# Patient Record
Sex: Male | Born: 1985 | Race: White | Hispanic: No | Marital: Single | Smoking: Former smoker
Health system: Southern US, Community
[De-identification: ages and names within clinical notes are randomized; demographics above are authoritative.]

## PROBLEM LIST (undated history)

## (undated) HISTORY — PX: CYST EXCISION: SHX5701

## (undated) HISTORY — PX: FRACTURE SURGERY: SHX138

## (undated) HISTORY — PX: FOOT FRACTURE SURGERY: SHX645

---

## 2016-01-19 ENCOUNTER — Ambulatory Visit: Payer: Self-pay | Admitting: Adult Health

## 2016-01-19 DIAGNOSIS — Z0289 Encounter for other administrative examinations: Secondary | ICD-10-CM

## 2017-06-06 ENCOUNTER — Ambulatory Visit (INDEPENDENT_AMBULATORY_CARE_PROVIDER_SITE_OTHER): Payer: BLUE CROSS/BLUE SHIELD | Admitting: Adult Health

## 2017-06-06 ENCOUNTER — Encounter: Payer: Self-pay | Admitting: Adult Health

## 2017-06-06 ENCOUNTER — Other Ambulatory Visit: Payer: Self-pay | Admitting: Adult Health

## 2017-06-06 VITALS — BP 107/63 | HR 69 | Ht 74.0 in | Wt 182.3 lb

## 2017-06-06 DIAGNOSIS — N5089 Other specified disorders of the male genital organs: Secondary | ICD-10-CM | POA: Insufficient documentation

## 2017-06-06 DIAGNOSIS — Z0000 Encounter for general adult medical examination without abnormal findings: Secondary | ICD-10-CM | POA: Diagnosis not present

## 2017-06-06 NOTE — Progress Notes (Signed)
Subjective:    Patient ID: Gary Maxwell, male    DOB: [birth year], 31 y.o.   MRN: [id number]  HPI:  [name] is here to establish as a new pt.  He is a very pleasant 31 year old.  PMH:  Cyst excision from lower back and surgical repair of fx'd 5th metatarsal and fx'd L FA.  He drinks water all day (at least 80 ounces) and follows a heart healthy diet, he actually grows a lot of his own fruits/vegetables.  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  He has one acute compliant today: L testicular pain that started > 3 weeks ago.  He denies acute/injury trauma to L testicle prior to onset of pain.  He cannot describe the characteristics of the pain, however feels that it occurs intermittently and is rated 6/10.  He denies penile discharge, hematuria, N/V/D/constipation.  He has had the same monogamous sexual partner for > years and reports consistent condom use.  He reports that his biological father had a testicle removed at age 30, reason-? His biological father passed away at age 30 from suicide. He denies tobacco/regular ETOH use.    Patient Care Team    Relationship Specialty Notifications Start End  Onida, Jinny Blossom, NP PCP - General Family Medicine  05/28/17     There are no active problems to display for this patient.    History reviewed. No pertinent past medical history.   Past Surgical History:  Procedure Laterality Date  . CYST EXCISION     lower back  . FOOT FRACTURE SURGERY Right    5th metatarsal  . FRACTURE SURGERY Left    arm     Family History  Problem Relation Age of Onset  . Healthy Sister   . Healthy Brother      History  Drug Use No     History  Alcohol Use  . Yes    Comment: occassionally     History  Smoking Status  . Former Smoker  . Packs/day: 1.00  . Years: 5.00  . Types: Cigarettes  . Quit date: 09/10/2010  Smokeless Tobacco  . Never Used      Outpatient Encounter Prescriptions as of 06/06/2017  Medication Sig  . Multiple Vitamin (MULTIVITAMIN) tablet Take 1 tablet by mouth daily.   No facility-administered encounter medications on file as of 06/06/2017.     Allergies:  Patient has no known allergies.  There is no height or weight on file to calculate BMI.  There were no vitals taken for this visit.   Review of Systems  Constitutional: Positive for fatigue. Negative for activity change, appetite change, chills, diaphoresis, fever and unexpected weight change.  HENT: Negative for congestion.   Eyes: Negative for visual disturbance.  Respiratory: Negative for cough, chest tightness, shortness of breath, wheezing and stridor.   Cardiovascular: Negative for chest pain, palpitations and leg swelling.  Gastrointestinal: Negative for abdominal distention, blood in stool, constipation, diarrhea, nausea and vomiting.  Endocrine: Negative for cold intolerance, heat intolerance, polydipsia, polyphagia and polyuria.  Genitourinary: Positive for testicular pain. Negative for decreased urine volume, difficulty urinating, discharge, flank pain, genital sores, hematuria, penile pain, penile swelling, scrotal swelling and urgency.  Musculoskeletal: Negative for arthralgias, back pain, gait problem, joint swelling, myalgias, neck pain and neck stiffness.  Skin: Negative for color change, pallor, rash and wound.  Neurological: Negative for dizziness, tremors,  weakness, light-headedness and headaches.  Hematological: Does not bruise/bleed easily.  Psychiatric/Behavioral: Negative for decreased concentration, dysphoric mood, hallucinations, self-injury, sleep disturbance and suicidal ideas. The patient is not nervous/anxious and is not hyperactive.        Objective:   Physical Exam  Constitutional: He is oriented to person, place, and time. He appears well-developed and well-nourished. No distress.  HENT:  Head: Normocephalic and  atraumatic.  Right Ear: External ear normal.  Left Ear: External ear normal.  Eyes: Pupils are equal, round, and reactive to light. Conjunctivae are normal.  Cardiovascular: Normal rate, regular rhythm, normal heart sounds and intact distal pulses.   No murmur heard. Pulmonary/Chest: Effort normal and breath sounds normal. No respiratory distress. He has no wheezes. He has no rales. He exhibits no tenderness.  Abdominal: Soft. Bowel sounds are normal. He exhibits no distension and no mass. There is no tenderness. There is no rebound and no guarding. Hernia confirmed negative in the right inguinal area and confirmed negative in the left inguinal area.  Genitourinary: Prostate normal and penis normal. Right testis shows no mass, no swelling and no tenderness. Right testis is descended. Cremasteric reflex is not absent on the right side. Left testis shows tenderness. Left testis shows no mass and no swelling. Left testis is descended. Cremasteric reflex is not absent on the left side. No penile tenderness.  Genitourinary Comments: Chaperone present during examination.  Lymphadenopathy:       Right: No inguinal adenopathy present.       Left: No inguinal adenopathy present.  Neurological: He is alert and oriented to person, place, and time.  Skin: Skin is warm and dry. No rash noted. He is not diaphoretic. No erythema. No pallor.  Psychiatric: He has a normal mood and affect. His behavior is normal. Judgment and thought content normal.  Nursing note and vitals reviewed.         Assessment & Plan:   1. Testicular swelling, left   2. Healthcare maintenance     Testicular swelling, left Physical examination negative. Father had one testicle removed, reason unknown.  Father deceased at age 30 from suicide. [place] ordered.  Healthcare maintenance Continue to drink water and follow Heart Healthy diet, super great that your grown a lot of your own food. Continue regular movement. Scrotal  Ultrasound ordered. Please schedule complete physical with fasting labs this fall/winter.    FOLLOW-UP:  Return in about 3 months (around 09/05/2017) for CPE, Fasting Lab Draw.

## 2017-06-06 NOTE — Assessment & Plan Note (Signed)
Physical examination negative. Father had one testicle removed, reason unknown.  Father deceased at age 30 from suicide. [place] ordered.

## 2017-06-06 NOTE — Patient Instructions (Addendum)
Heart-Healthy Eating Plan Many factors influence your heart health, including eating and exercise habits. Heart (coronary) risk increases with abnormal blood fat (lipid) levels. Heart-healthy meal planning includes limiting unhealthy fats, increasing healthy fats, and making other small dietary changes. This includes maintaining a healthy body weight to help keep lipid levels within a normal range. What is my plan? Your health care provider recommends that you:  Get no more than __25__% of the total calories in your daily diet from fat.  Limit your intake of saturated fat to less than __5__% of your total calories each day.  Limit the amount of cholesterol in your diet to less than __300__ mg per day.  What types of fat should I choose?  Choose healthy fats more often. Choose monounsaturated and polyunsaturated fats, such as olive oil and canola oil, flaxseeds, walnuts, almonds, and seeds.  Eat more omega-3 fats. Good choices include salmon, mackerel, sardines, tuna, flaxseed oil, and ground flaxseeds. Aim to eat fish at least two times each week.  Limit saturated fats. Saturated fats are primarily found in animal products, such as meats, butter, and cream. Plant sources of saturated fats include palm oil, palm kernel oil, and coconut oil.  Avoid foods with partially hydrogenated oils in them. These contain trans fats. Examples of foods that contain trans fats are stick margarine, some tub margarines, cookies, crackers, and other baked goods. What general guidelines do I need to follow?  Check food labels carefully to identify foods with trans fats or high amounts of saturated fat.  Fill one half of your plate with vegetables and green salads. Eat 4-5 servings of vegetables per day. A serving of vegetables equals 1 cup of raw leafy vegetables,  cup of raw or cooked cut-up vegetables, or  cup of vegetable juice.  Fill one fourth of your plate with whole grains. Look for the word "whole" as  the first word in the ingredient list.  Fill one fourth of your plate with lean protein foods.  Eat 4-5 servings of fruit per day. A serving of fruit equals one medium whole fruit,  cup of dried fruit,  cup of fresh, frozen, or canned fruit, or  cup of 100% fruit juice.  Eat more foods that contain soluble fiber. Examples of foods that contain this type of fiber are apples, broccoli, carrots, beans, peas, and barley. Aim to get 20-30 g of fiber per day.  Eat more home-cooked food and less restaurant, buffet, and fast food.  Limit or avoid alcohol.  Limit foods that are high in starch and sugar.  Avoid fried foods.  Cook foods by using methods other than frying. Baking, boiling, grilling, and broiling are all great options. Other fat-reducing suggestions include: ? Removing the skin from poultry. ? Removing all visible fats from meats. ? Skimming the fat off of stews, soups, and gravies before serving them. ? Steaming vegetables in water or broth.  Lose weight if you are overweight. Losing just 5-10% of your initial body weight can help your overall health and prevent diseases such as diabetes and heart disease.  Increase your consumption of nuts, legumes, and seeds to 4-5 servings per week. One serving of dried beans or legumes equals  cup after being cooked, one serving of nuts equals 1 ounces, and one serving of seeds equals  ounce or 1 tablespoon.  You may need to monitor your salt (sodium) intake, especially if you have high blood pressure. Talk with your health care provider or dietitian to get  more information about reducing sodium. What foods can I eat? Grains  Breads, including [place], white, pita, wheat, raisin, rye, oatmeal, and [place]. Tortillas that are neither fried nor made with lard or trans fat. Low-fat rolls, including hotdog and hamburger buns and English muffins. Biscuits. Muffins. Waffles. Pancakes. Light popcorn. Whole-grain cereals. Flatbread. Melba toast.  Pretzels. Breadsticks. Rusks. Low-fat snacks and crackers, including oyster, saltine, matzo, graham, animal, and rye. Rice and pasta, including brown rice and those that are made with whole wheat. Vegetables All vegetables. Fruits All fruits, but limit coconut. Meats and Other Protein Sources Lean, well-trimmed beef, veal, pork, and lamb. Chicken and [place] without skin. All fish and shellfish. Wild duck, rabbit, pheasant, and venison. Egg whites or low-cholesterol egg substitutes. Dried beans, peas, lentils, and tofu.Seeds and most nuts. Dairy Low-fat or nonfat cheeses, including ricotta, string, and mozzarella. Skim or 1% milk that is liquid, powdered, or evaporated. Buttermilk that is made with low-fat milk. Nonfat or low-fat yogurt. Beverages Mineral water. Diet carbonated beverages. Sweets and Desserts Sherbets and fruit ices. Honey, jam, marmalade, jelly, and syrups. Meringues and gelatins. Pure sugar candy, such as hard candy, jelly beans, gumdrops, mints, marshmallows, and small amounts of dark chocolate. [employer] Eat all sweets and desserts in moderation. Fats and Oils Nonhydrogenated (trans-free) margarines. Vegetable oils, including soybean, sesame, sunflower, olive, peanut, safflower, corn, canola, and cottonseed. Salad dressings or mayonnaise that are made with a vegetable oil. Limit added fats and oils that you use for cooking, baking, salads, and as spreads. Other Cocoa powder. Coffee and tea. All seasonings and condiments. The items listed above may not be a complete list of recommended foods or beverages. Contact your dietitian for more options. What foods are not recommended? Grains Breads that are made with saturated or trans fats, oils, or whole milk. Croissants. Butter rolls. Cheese breads. Sweet rolls. Donuts. Buttered popcorn. Chow mein noodles. High-fat crackers, such as cheese or butter crackers. Meats and Other Protein Sources Fatty meats, such as hotdogs,  short ribs, sausage, spareribs, bacon, ribeye roast or steak, and mutton. High-fat deli meats, such as salami and bologna. Caviar. Domestic duck and goose. Organ meats, such as kidney, liver, sweetbreads, brains, gizzard, chitterlings, and heart. Dairy Cream, sour cream, cream cheese, and creamed cottage cheese. Whole milk cheeses, including blue (bleu), Monterey Jack, [place], [place], American, [place], Swiss, [place], [place], and [place]. Whole or 2% milk that is liquid, evaporated, or condensed. Whole buttermilk. Cream sauce or high-fat cheese sauce. Yogurt that is made from whole milk. Beverages Regular sodas and drinks with added sugar. Sweets and Desserts Frosting. Pudding. Cookies. Cakes other than angel food cake. Candy that has milk chocolate or white chocolate, hydrogenated fat, butter, coconut, or unknown ingredients. Buttered syrups. Full-fat ice cream or ice cream drinks. Fats and Oils Gravy that has suet, meat fat, or shortening. Cocoa butter, hydrogenated oils, palm oil, coconut oil, palm kernel oil. These can often be found in baked products, candy, fried foods, nondairy creamers, and whipped toppings. Solid fats and shortenings, including bacon fat, salt pork, lard, and butter. Nondairy cream substitutes, such as coffee creamers and sour cream substitutes. Salad dressings that are made of unknown oils, cheese, or sour cream. The items listed above may not be a complete list of foods and beverages to avoid. Contact your dietitian for more information. This information is not intended to replace advice given to you by your health care provider. Make sure you discuss any questions you have with your health care  provider. Document Released: 06/05/2008 Document Revised: 03/16/2016 Document Reviewed: 02/18/2014 Elsevier Interactive Patient Education  2017 [employer].   Continue to drink water and follow Heart Healthy diet, super great that your grown a lot of your own food. Continue  regular movement. Scrotal Ultrasound ordered. Please schedule complete physical with fasting labs this fall/winter. WELCOME TO THE PRACTICE!

## 2017-06-06 NOTE — Assessment & Plan Note (Signed)
Continue to drink water and follow Heart Healthy diet, super great that your grown a lot of your own food. Continue regular movement. Scrotal Ultrasound ordered. Please schedule complete physical with fasting labs this fall/winter.

## 2017-06-11 ENCOUNTER — Ambulatory Visit
Admission: RE | Admit: 2017-06-11 | Discharge: 2017-06-11 | Disposition: A | Discharge status: Home / Self Care | Payer: BLUE CROSS/BLUE SHIELD | Source: Ambulatory Visit | Attending: Adult Health | Admitting: Adult Health

## 2017-06-11 DIAGNOSIS — N5089 Other specified disorders of the male genital organs: Secondary | ICD-10-CM

## 2017-06-16 ENCOUNTER — Other Ambulatory Visit: Payer: Self-pay | Admitting: Adult Health

## 2017-06-16 DIAGNOSIS — N433 Hydrocele, unspecified: Secondary | ICD-10-CM

## 2017-06-26 NOTE — Progress Notes (Signed)
Subjective:    Patient ID: Gary Maxwell, male    DOB: 05-Sep-1986, 31 y.o.   MRN: [id number]  HPI: OV Note 06/06/2017:  Gary Maxwell is here to establish as a new pt.  He is a very pleasant 31 year old.  PMH:  Cyst excision from lower back and surgical repair of fx'd 5th metatarsal and fx'd L FA.  He drinks water all day (at least 80 ounces) and follows a heart healthy diet, he actually grows a lot of his own fruits/vegetables.  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  He has one acute compliant today: L testicular pain that started > 3 weeks ago.  He denies acute/injury trauma to L testicle prior to onset of pain.  He cannot describe the characteristics of the pain, however feels that it occurs intermittently and is rated 6/10.  He denies penile discharge, hematuria, N/V/D/constipation.  He has had the same monogamous sexual partner for > years and reports consistent condom use.  He reports that his biological father had a testicle removed at age 30, reason-? His biological father passed away at age 30 from suicide. He denies tobacco/regular ETOH use.   Today's OV Note 06/02/2017: Gary Maxwell presents today for CPE.  He denies any acute issues and reports testicular pain has resolved.  We discussed scrotal US results and that Urology referral has been placed. 06/11/2017 US IMPRESSION: 1. Small bilateral hydroceles. 2. Otherwise normal testicular ultrasound. No mass lesion or other acute abnormality identified. Overall he feels that his health is excellent.   Patient Care Team    Relationship Specialty Notifications Start End  Julaine Fusianford, Katy D, NP PCP - General Family Medicine  05/28/17     Patient Active Problem List   Diagnosis Date Noted  . Healthcare maintenance 06/06/2017  . Testicular swelling, left 06/06/2017     No past medical history on file.   Past Surgical History:  Procedure Laterality  Date  . CYST EXCISION     lower back  . FOOT FRACTURE SURGERY Right    5th metatarsal  . FRACTURE SURGERY Left    arm     Family History  Problem Relation Age of Onset  . Healthy Sister   . Healthy Brother      History  Drug Use No     History  Alcohol Use  . Yes    Comment: occassionally     History  Smoking Status  . Former Smoker  . Packs/day: 1.00  . Years: 5.00  . Types: Cigarettes  . Quit date: 09/10/2010  Smokeless Tobacco  . Never Used     Outpatient Encounter Prescriptions as of 07/02/2017  Medication Sig  . Multiple Vitamin (MULTIVITAMIN) tablet Take 1 tablet by mouth daily.   No facility-administered encounter medications on file as of 07/02/2017.     Allergies:  Patient has no known allergies.  There is no height or weight on file to calculate BMI.  There were no vitals taken for this visit.   Review of Systems  Constitutional: Positive for fatigue. Negative for activity change, appetite change, chills, diaphoresis, fever and unexpected weight change.  HENT: Negative for congestion.   Eyes: Negative for visual disturbance.  Respiratory: Negative for cough, chest tightness, shortness of breath, wheezing and stridor.   Cardiovascular: Negative for chest pain, palpitations and leg swelling.  Gastrointestinal: Negative for abdominal distention, blood in stool,  constipation, diarrhea, nausea and vomiting.  Endocrine: Negative for cold intolerance, heat intolerance, polydipsia, polyphagia and polyuria.  Genitourinary: Negative for decreased urine volume, difficulty urinating, discharge, flank pain, genital sores, hematuria, penile pain, penile swelling, scrotal swelling, testicular pain and urgency.  Musculoskeletal: Negative for arthralgias, back pain, gait problem, joint swelling, myalgias, neck pain and neck stiffness.  Skin: Negative for color change, pallor, rash and wound.  Neurological: Negative for dizziness, tremors, weakness,  light-headedness and headaches.  Hematological: Does not bruise/bleed easily.  Psychiatric/Behavioral: Negative for decreased concentration, dysphoric mood, hallucinations, self-injury, sleep disturbance and suicidal ideas. The patient is not nervous/anxious and is not hyperactive.        Objective:   Physical Exam  Constitutional: He is oriented to person, place, and time. He appears well-developed and well-nourished. No distress.  HENT:  Head: Normocephalic and atraumatic.  Right Ear: Hearing, tympanic membrane, external ear and ear canal normal. Tympanic membrane is not erythematous and not bulging. No decreased hearing is noted.  Left Ear: Hearing, tympanic membrane, external ear and ear canal normal. Tympanic membrane is not erythematous and not bulging. No decreased hearing is noted.  Nose: Nose normal. Right sinus exhibits no maxillary sinus tenderness and no frontal sinus tenderness. Left sinus exhibits no maxillary sinus tenderness and no frontal sinus tenderness.  Mouth/Throat: Uvula is midline, oropharynx is clear and moist and mucous membranes are normal.  Eyes: Pupils are equal, round, and reactive to light. Conjunctivae are normal.  Neck: Normal range of motion. Neck supple. No thyromegaly present.  Cardiovascular: Normal rate, regular rhythm, normal heart sounds and intact distal pulses.   No murmur heard. Pulmonary/Chest: Effort normal and breath sounds normal. No respiratory distress. He has no wheezes. He has no rales. He exhibits no tenderness.  Abdominal: Soft. Bowel sounds are normal. He exhibits no distension and no mass. There is no tenderness. There is no rebound and no guarding.  Genitourinary:  Genitourinary Comments: Declined DRE and testicular examination today.  Musculoskeletal: Normal range of motion. He exhibits no edema, tenderness or deformity.  Lymphadenopathy:    He has no cervical adenopathy.  Neurological: He is alert and oriented to person, place, and  time. Coordination normal.  Skin: Skin is warm and dry. No rash noted. He is not diaphoretic. No erythema. No pallor.  Psychiatric: He has a normal mood and affect. His behavior is normal. Judgment and thought content normal.  Nursing note and vitals reviewed.         Assessment & Plan:   1. Healthcare maintenance   2. Testicular swelling, left     Testicular swelling, left None at OV today, reviewed scrotal US results and Urology referral placed 06/16/2017 He denies blood in urine or penile d/c.  Healthcare maintenance He declined HIV screening/Tetanus/Tdap/Influenza immunizations today. Continue to excellent water intake and heart healthy diet. Continue regular movement via work and daily walking. Will call when lab results are available. Annual follow-up sooner if needed.    FOLLOW-UP:  No Follow-up on file. No diagnosis found.  No problem-specific Assessment & Plan notes found for this encounter.    FOLLOW-UP:  No Follow-up on file.

## 2017-07-02 ENCOUNTER — Ambulatory Visit (INDEPENDENT_AMBULATORY_CARE_PROVIDER_SITE_OTHER): Payer: BLUE CROSS/BLUE SHIELD | Admitting: Adult Health

## 2017-07-02 ENCOUNTER — Encounter: Payer: Self-pay | Admitting: Adult Health

## 2017-07-02 DIAGNOSIS — Z0000 Encounter for general adult medical examination without abnormal findings: Secondary | ICD-10-CM

## 2017-07-02 DIAGNOSIS — N5089 Other specified disorders of the male genital organs: Secondary | ICD-10-CM

## 2017-07-02 NOTE — Patient Instructions (Signed)

## 2017-07-02 NOTE — Assessment & Plan Note (Signed)
None at OV today, reviewed scrotal US results and Urology referral placed 06/16/2017 He denies blood in urine or penile d/c.

## 2017-07-02 NOTE — Assessment & Plan Note (Signed)
He declined HIV screening/Tetanus/Tdap/Influenza immunizations today. Continue to excellent water intake and heart healthy diet. Continue regular movement via work and daily walking. Will call when lab results are available. Annual follow-up sooner if needed.

## 2017-07-03 LAB — COMPREHENSIVE METABOLIC PANEL
A/G RATIO: 1.4 (ref 1.2–2.2)
ALT: 32 IU/L (ref 0–44)
AST: 25 IU/L (ref 0–40)
Albumin: 4.2 g/dL (ref 3.5–5.5)
Alkaline Phosphatase: 65 IU/L (ref 39–117)
BILIRUBIN TOTAL: 0.6 mg/dL (ref 0.0–1.2)
BUN/Creatinine Ratio: 16 (ref 9–20)
BUN: 12 mg/dL (ref 6–20)
CHLORIDE: 102 mmol/L (ref 96–106)
CO2: 27 mmol/L (ref 20–29)
Calcium: 9.7 mg/dL (ref 8.7–10.2)
Creatinine, Ser: 0.73 mg/dL — ABNORMAL LOW (ref 0.76–1.27)
GFR calc non Af Amer: 124 mL/min/{1.73_m2} (ref >59)
GFR, EST AFRICAN AMERICAN: 144 mL/min/{1.73_m2} (ref >59)
Globulin, Total: 2.9 g/dL (ref 1.5–4.5)
Glucose: 87 mg/dL (ref 65–99)
POTASSIUM: 4.5 mmol/L (ref 3.5–5.2)
SODIUM: 142 mmol/L (ref 134–144)
TOTAL PROTEIN: 7.1 g/dL (ref 6.0–8.5)

## 2017-07-03 LAB — CBC WITH DIFFERENTIAL/PLATELET
Basophils Absolute: 0 10*3/uL (ref 0.0–0.2)
Basos: 1 %
EOS (ABSOLUTE): 0.1 10*3/uL (ref 0.0–0.4)
EOS: 3 %
Hematocrit: 43.7 % (ref 37.5–51.0)
Hemoglobin: 15.4 g/dL (ref 13.0–17.7)
IMMATURE GRANULOCYTES: 0 %
Immature Grans (Abs): 0 10*3/uL (ref 0.0–0.1)
Lymphocytes Absolute: 1.7 10*3/uL (ref 0.7–3.1)
Lymphs: 39 %
MCH: 34.9 pg — ABNORMAL HIGH (ref 26.6–33.0)
MCHC: 35.2 g/dL (ref 31.5–35.7)
MCV: 99 fL — ABNORMAL HIGH (ref 79–97)
MONOS ABS: 0.4 10*3/uL (ref 0.1–0.9)
Monocytes: 10 %
NEUTROS PCT: 47 %
Neutrophils Absolute: 2.1 10*3/uL (ref 1.4–7.0)
PLATELETS: 319 10*3/uL (ref 150–379)
RBC: 4.41 x10E6/uL (ref 4.14–5.80)
RDW: 12.4 % (ref 12.3–15.4)
WBC: 4.4 10*3/uL (ref 3.4–10.8)

## 2017-07-03 LAB — TSH: TSH: 1.24 u[IU]/mL (ref 0.450–4.500)

## 2017-07-03 LAB — LIPID PANEL
CHOLESTEROL TOTAL: 144 mg/dL (ref 100–199)
Chol/HDL Ratio: 3.5 ratio (ref 0.0–5.0)
HDL: 41 mg/dL (ref >39)
LDL Calculated: 92 mg/dL (ref 0–99)
Triglycerides: 55 mg/dL (ref 0–149)
VLDL Cholesterol Cal: 11 mg/dL (ref 5–40)

## 2017-07-03 LAB — HEMOGLOBIN A1C
Est. average glucose Bld gHb Est-mCnc: 97 mg/dL
Hgb A1c MFr Bld: 5 % (ref 4.8–5.6)

## 2017-07-03 LAB — VITAMIN D 25 HYDROXY (VIT D DEFICIENCY, FRACTURES): Vit D, 25-Hydroxy: 33.4 ng/mL (ref 30.0–100.0)

## 2018-05-19 ENCOUNTER — Telehealth: Payer: Self-pay | Admitting: Adult Health

## 2018-05-19 NOTE — Telephone Encounter (Signed)
Patient complains of pain in both knees & shins while at work on feet all day, request provider/nurse call him back w/ advice.-- I suggested he contact an Orthopedic provider but patient wants PCP suggestions.  ---Forwarding message to medical assistant to contact pt at [phone].  --glh

## 2018-05-20 NOTE — Progress Notes (Signed)
Subjective:    Patient ID: Chirstopher Iovino, male    DOB: 21-Apr-1986, 32 y.o.   MRN: [id number]  HPI:  Mr. Pallone presents with bil knee pain and bil shin pain that has been presents >6 weeks. He reports that these issues have been intermittent for >4 years and will worsen with high volume walking. He denies acute injury/accident prior to onset of sx's. 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 He reports bil shin pain will develop after prolonged standing/walking, pain described as "pulling" and will often radiate to calf, rated 6/10 Bil knee pain will develop after bending/stooping, described as "burning" localized over patella region, rated 7/10 He reports intermittent "popping/clicking" of knees with flexion He has been taking OTC Glucosamine and Tumeric >1 month with minor sx relief. He has changed out running shoes w/o sx relief He does not wear shoe inserts He has not used any ice or OTC analgesics   Patient Care Team    Relationship Specialty Notifications Start End  Jassen Sarver, Jinny Blossom, NP PCP - General Family Medicine  05/28/17     Patient Active Problem List   Diagnosis Date Noted  . Chronic pain of both knees 05/22/2018  . Shin splint of right lower extremity 05/22/2018  . Healthcare maintenance 06/06/2017  . Testicular swelling, left 06/06/2017     History reviewed. No pertinent past medical history.   Past Surgical History:  Procedure Laterality Date  . CYST EXCISION     lower back  . FOOT FRACTURE SURGERY Right    5th metatarsal  . FRACTURE SURGERY Left    arm     Family History  Problem Relation Age of Onset  . Healthy Sister   . Healthy Brother      Social History   Substance and Sexual Activity  Drug Use No     Social History   Substance and Sexual Activity  Alcohol Use Yes   Comment: occassionally     Social History   Tobacco Use   Smoking Status Former Smoker  . Packs/day: 1.00  . Years: 5.00  . Pack years: 5.00  . Types: Cigarettes  . Last attempt to quit: 09/10/2010  . Years since quitting: 7.7  Smokeless Tobacco Never Used     No outpatient encounter medications on file as of 05/22/2018.   No facility-administered encounter medications on file as of 05/22/2018.     Allergies: Patient has no known allergies.  Body mass index is 22.97 kg/m.  Blood pressure (!) 100/59, pulse (!) 55, height 6\' 2"  (1.88 m), weight 178 lb 14.4 oz (81.1 kg), SpO2 100 %.  Review of Systems  Constitutional: Negative for activity change, appetite change, chills, diaphoresis, fatigue, fever and unexpected weight change.  Eyes: Negative for visual disturbance.  Respiratory: Negative for cough, chest tightness, shortness of breath, wheezing and stridor.   Cardiovascular: Negative for chest pain, palpitations and leg swelling.  Musculoskeletal: Positive for arthralgias and myalgias. Negative for back pain, gait problem, joint swelling, neck pain and neck stiffness.  Skin: Negative for color change, pallor, rash and wound.  Neurological: Negative for dizziness and headaches.  Hematological: Does not bruise/bleed easily.       Objective:   Physical Exam  Constitutional: He is oriented to person, place, and time. He appears well-developed and well-nourished. No distress.  HENT:  Head: Normocephalic and atraumatic.  Right Ear: External ear normal.  Left Ear: External ear normal.  Nose: Nose normal.  Mouth/Throat: Oropharynx is clear and moist.  Eyes: Pupils are equal, round, and reactive to light. Conjunctivae and EOM are normal.  Cardiovascular: Regular rhythm, normal heart sounds and intact distal pulses. Bradycardia present.  No murmur heard. Well conditioned cardiovascular system, bradycardia is his baseline  Pulmonary/Chest: Effort normal and breath sounds normal. No stridor. No respiratory distress. He has no wheezes. He  has no rales. He exhibits no tenderness.  Musculoskeletal: Normal range of motion. He exhibits no edema or deformity.       Right hip: Normal.       Left hip: Normal.       Right knee: He exhibits normal range of motion, no swelling, no erythema, normal alignment, no LCL laxity, normal patellar mobility, no bony tenderness, normal meniscus and no MCL laxity. Tenderness found. Patellar tendon tenderness noted.       Left knee: He exhibits normal range of motion, no erythema, normal alignment, no LCL laxity, normal patellar mobility, no bony tenderness, normal meniscus and no MCL laxity. Tenderness found. Patellar tendon tenderness noted.       Right lower leg: Normal.       Left lower leg: Normal.  Neurological: He is alert and oriented to person, place, and time.  Skin: He is not diaphoretic.      Assessment & Plan:   1. Chronic pain of both knees   2. Left medial tibial stress syndrome, initial encounter   3. Right medial tibial stress syndrome, initial encounter     Chronic pain of both knees Recommend stretching in am/pm. Recommend ice nightly for at least on shins, place barrier in between ice and skin. Recommend OTC Ibuprofen with food as needed for pain/swelling. Continue with excellent diet and OTC Glucosamine and Tumeric to help with joint health and reduce inflammation. Referral to Physical Therapy placed. Return if symptoms worsen, or persist >1 month.  Pt was in the office today for 25+ minutes, I spent >75% of time in face to face counseling of patient's various medical conditions and in coordination of care  FOLLOW-UP:  Return if symptoms worsen or fail to improve.

## 2018-05-20 NOTE — Telephone Encounter (Signed)
Spoke with Gary Maxwell who states that he believes his issues may be coming from wearing worn out shoes and his arches "falling".  He states that he is on his feet a lot at work.  Gary Maxwell denies having an orthopedist.  Advised Gary Maxwell that he would need OV for evaluation and possible referral.  Gary Maxwell transferred to front desk to schedule appt.  Tiajuana Amass, CMA

## 2018-05-22 ENCOUNTER — Encounter: Payer: Self-pay | Admitting: Adult Health

## 2018-05-22 ENCOUNTER — Ambulatory Visit (INDEPENDENT_AMBULATORY_CARE_PROVIDER_SITE_OTHER): Payer: BLUE CROSS/BLUE SHIELD | Admitting: Adult Health

## 2018-05-22 VITALS — BP 100/59 | HR 55 | Ht 74.0 in | Wt 178.9 lb

## 2018-05-22 DIAGNOSIS — M25561 Pain in right knee: Secondary | ICD-10-CM

## 2018-05-22 DIAGNOSIS — M25562 Pain in left knee: Secondary | ICD-10-CM

## 2018-05-22 DIAGNOSIS — S86891A Other injury of other muscle(s) and tendon(s) at lower leg level, right leg, initial encounter: Secondary | ICD-10-CM | POA: Diagnosis not present

## 2018-05-22 DIAGNOSIS — S86892A Other injury of other muscle(s) and tendon(s) at lower leg level, left leg, initial encounter: Secondary | ICD-10-CM

## 2018-05-22 DIAGNOSIS — G8929 Other chronic pain: Secondary | ICD-10-CM

## 2018-05-22 NOTE — Assessment & Plan Note (Signed)
Recommend stretching in am/pm. Recommend ice nightly for at least on shins, place barrier in between ice and skin. Recommend OTC Ibuprofen with food as needed for pain/swelling. Continue with excellent diet and OTC Glucosamine and Tumeric to help with joint health and reduce inflammation. Referral to Physical Therapy placed. Return if symptoms worsen, or persist >1 month.

## 2018-05-22 NOTE — Patient Instructions (Signed)
Shin Splints  Shin splints is a painful condition that is felt on the bone that is located in the front of the lower leg (tibia or shin bone) or in the muscles on either side of the bone. This condition happens when physical activities lead to inflammation of the muscles, tendons, and the thin layer that covers the shin bone. It may result from participating in sports or other demanding exercise.  What are the causes?  This condition may be caused by:  · Overuse of muscles.  · Repetitive activities.  · Flat feet or rigid arches.    Activities that could contribute to shin splints include:  · Having a sudden increase in exercise time.  · Starting a new, demanding activity.  · Running up hills or long distances.  · Playing sports that involve sudden starts and stops.  · Using a poor warm-up.  · Wearing old or worn-out shoes.    What are the signs or symptoms?  Symptoms of this condition include:  · Pain on the front of the lower leg.  · Pain while exercising or at rest.    How is this diagnosed?  This condition may be diagnosed based on a physical exam and the history of your symptoms. Your health care provider may observe you as you walk or run. You may also have X-rays or other imaging tests to rule out other problems that could cause lower leg pain, such as a stress fracture.  How is this treated?  Treatment for this condition may depend on your age, history, health, and how bad the pain is. Most cases of shin splints can be managed by doing one or more of the following:  · Resting.  · Reducing the length and intensity of your exercise.  · Stopping the activity that causes shin pain.  · Taking medicines to control the inflammation.  · Icing, massaging, stretching, and strengthening the affected area.  · Wearing shoes that have rigid heels, shock absorption, and a good arch support.    Follow these instructions at home:  Injury care  · If directed, apply ice to the injured area:  ? Put ice in a plastic bag.  ? Place  a towel between your skin and the bag.  ? Leave the ice on for 20 minutes, 2-3 times a day.  · Massage, stretch, and strengthen the affected area as directed by your health care provider.  Activity  · Rest as needed. Return to activity gradually as directed by your health care provider.  · Restart your exercise sessions with non-weight-bearing exercises, such as cycling or swimming.  · Stop running if the pain returns.  · Warm up properly before exercising.  · Run on a surface that is level and fairly firm.  · Gradually change the intensity of an exercise.  · If you increase your running distance, add only 5-10% to your distance each week. This means that if you are running 5 miles this week, you should only increase your run by ¼-½ mile for next week.  General instructions  · Take medicines only as directed by your health care provider.  · Wear shoes that have rigid heels, shock absorption, and a good arch support.  · Change your athletic shoes every 6 months, or every 350-450 miles.  Contact a health care provider if:  · Your symptoms continue or they worsen even after treatment.  · The location, intensity, or type of pain changes over time.  · You have   have with your health care provider. Document Released: 08/24/2000 Document Revised: 04/21/2016 Document Reviewed: 08/23/2014 Elsevier Interactive Patient Education  2018 [employer]  Knee Pain, Adult Knee pain in adults is common. It can be caused by many things, including:  Arthritis.  A fluid-filled sac (cyst) or growth in your knee.  An infection in your knee.  An injury that will not heal.  Damage, swelling, or irritation of the tissues that support  your knee.  Knee pain is usually not a sign of a serious problem. The pain may go away on its own with time and rest. If it does not, a health care provider may order tests to find the cause of the pain. These may include:  Imaging tests, such as an X-ray, MRI, or ultrasound.  Joint aspiration. In this test, fluid is removed from the knee.  Arthroscopy. In this test, a lighted tube is inserted into knee and an image is projected onto a TV screen.  A biopsy. In this test, a sample of tissue is removed from the body and studied under a microscope.  Follow these instructions at home: Pay attention to any changes in your symptoms. Take these actions to relieve your pain. Activity  Rest your knee.  Do not do things that cause pain or make pain worse.  Avoid high-impact activities or exercises, such as running, jumping rope, or doing jumping jacks. General instructions  Take over-the-counter and prescription medicines only as told by your health care provider.  Raise (elevate) your knee above the level of your heart when you are sitting or lying down.  Sleep with a pillow under your knee.  If directed, apply ice to the knee: ? Put ice in a plastic bag. ? Place a towel between your skin and the bag. ? Leave the ice on for 20 minutes, 2-3 times a day.  Ask your health care provider if you should wear an elastic knee support.  Lose weight if you are overweight. Extra weight can put pressure on your knee.  Do not use any products that contain nicotine or tobacco, such as cigarettes and e-cigarettes. Smoking may slow the healing of any bone and joint problems that you may have. If you need help quitting, ask your health care provider. Contact a health care provider if:  Your knee pain continues, changes, or gets worse.  You have a fever along with knee pain.  Your knee buckles or locks up.  Your knee swells, and the swelling becomes worse. Get help right away if:  Your knee  feels warm to the touch.  You cannot move your knee.  You have severe pain in your knee.  You have chest pain.  You have trouble breathing. Summary  Knee pain in adults is common. It can be caused by many things, including, arthritis, infection, cysts, or injury.  Knee pain is usually not a sign of a serious problem, but if it does not go away, a health care provider may perform tests to know the cause of the pain.  Pay attention to any changes in your symptoms. Relieve your pain with rest, medicines, light activity, and use of ice.  Get help if your pain continues or becomes very severe, or if your knee buckles or locks up, or if you have chest pain or trouble breathing. This information is not intended to replace advice given to you by your health care provider. Make sure you discuss any questions you have with your health care provider. Document  Released: 06/24/2007 Document Revised: 08/17/2016 Document Reviewed: 08/17/2016 Elsevier Interactive Patient Education  2018 [employer].  Recommend stretching in am/pm. Recommend ice nightly for at least on shins, place barrier in between ice and skin. Recommend OTC Ibuprofen with food as needed for pain/swelling. Continue with excellent diet and OTC Glucosamine and Tumeric to help with joint health and reduce inflammation. Referral to Physical Therapy placed. Return if symptoms worsen, or persist >1 month. NICE TO SEE YOU!

## 2018-07-03 ENCOUNTER — Encounter: Payer: BLUE CROSS/BLUE SHIELD | Admitting: Adult Health

## 2019-08-07 IMAGING — US US SCROTUM W/ DOPPLER COMPLETE
1 series · 14 of 25 positions shown · non-contrast
Comparison: None.

CLINICAL DATA: Initial evaluation for left-sided testicular
swelling.

EXAM:
SCROTAL ULTRASOUND
DOPPLER ULTRASOUND OF THE TESTICLES
TECHNIQUE: Complete ultrasound examination of the testicles, epididymis, and
other scrotal structures was performed. Color and spectral Doppler
ultrasound were also utilized to evaluate blood flow to the
testicles.

[Series 1: us scrotum w/ doppler complete · 0.08mm/px · 14 of 57 slices shown]
[im 1/57]
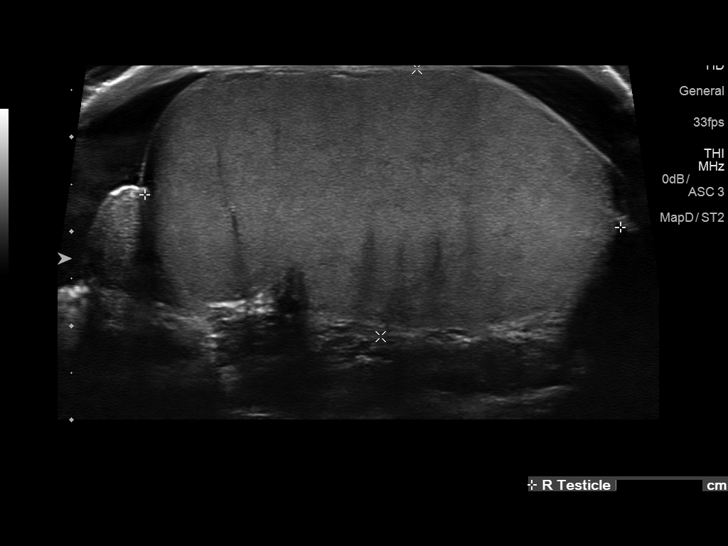
[im 5/57]
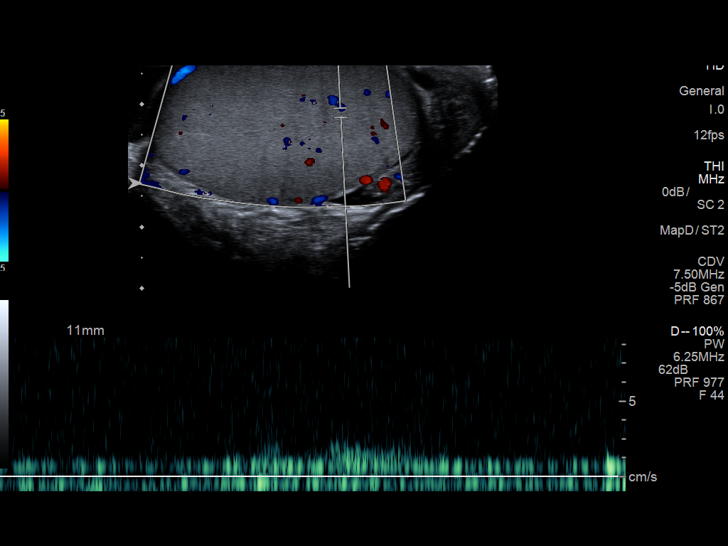
[im 10/57]
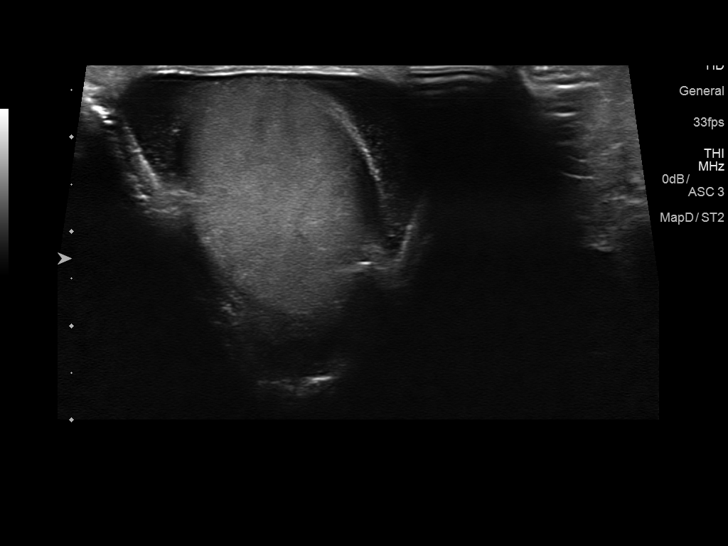
[im 15/57]
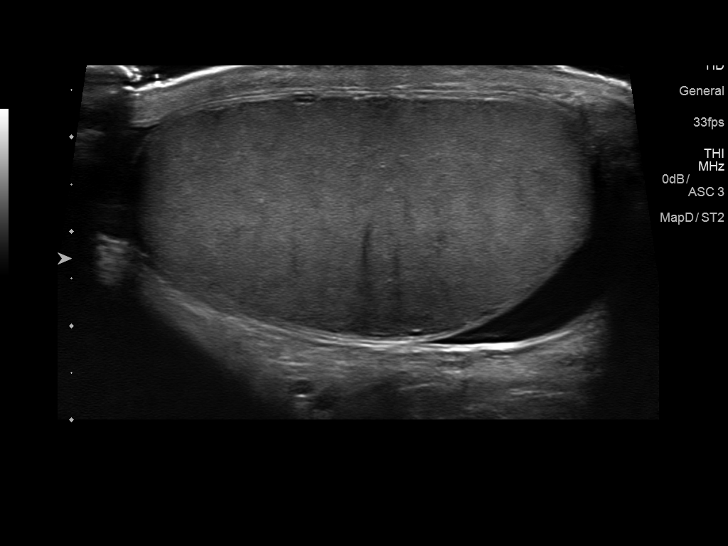
[im 19/57]
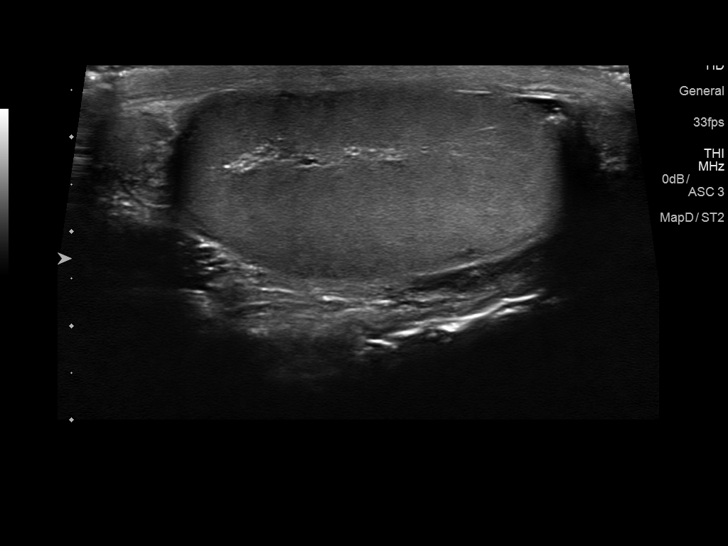
[im 22/57]
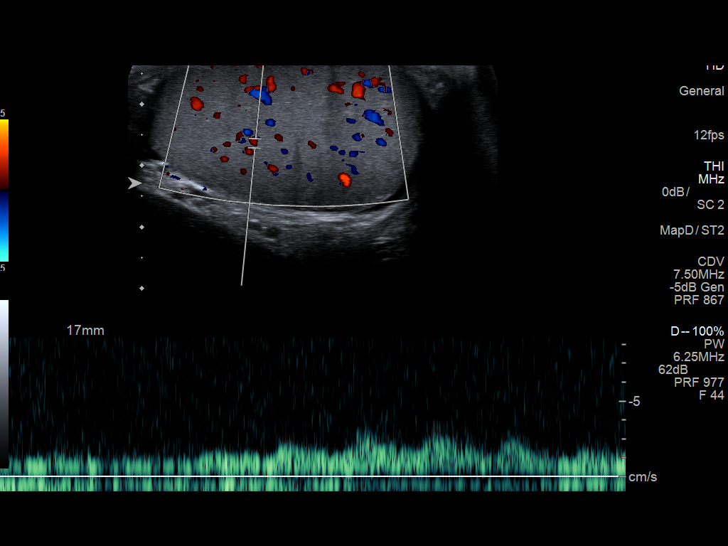
[im 26/57]
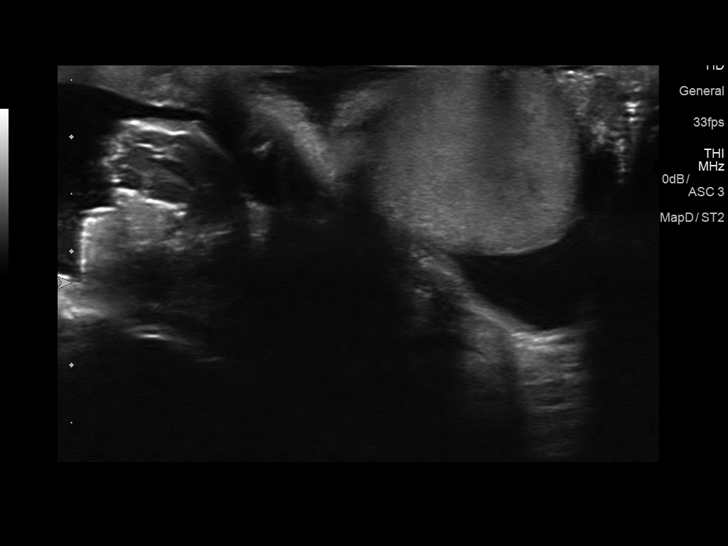
[im 31/57]
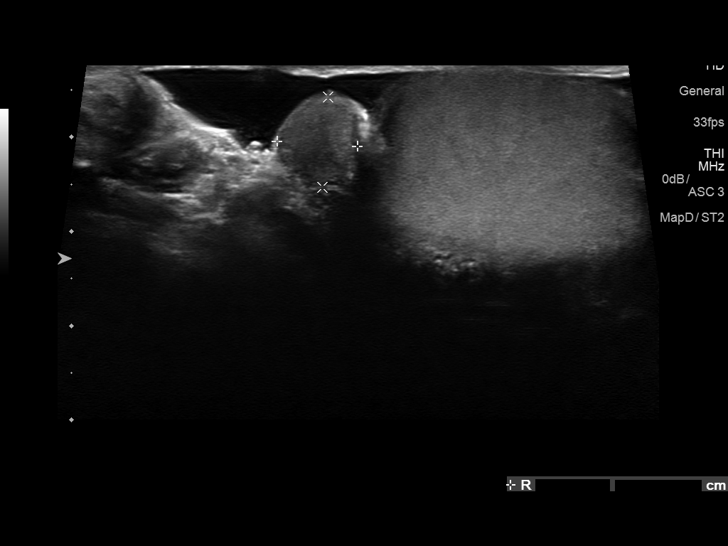
[im 36/57]
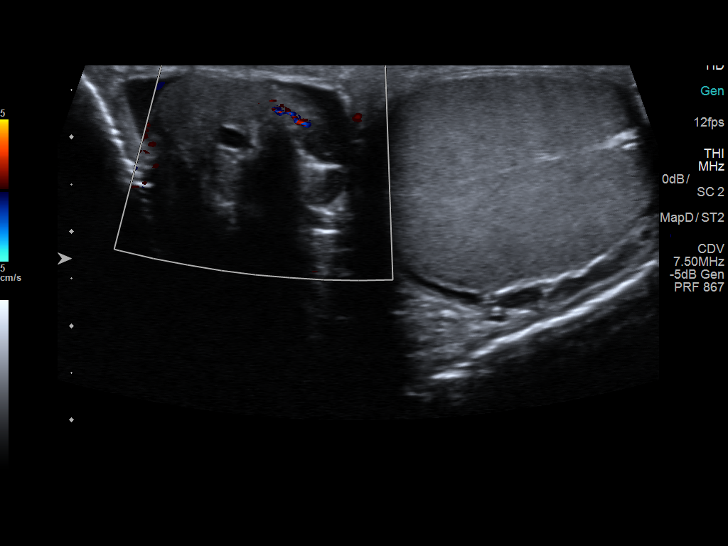
[im 38/57]
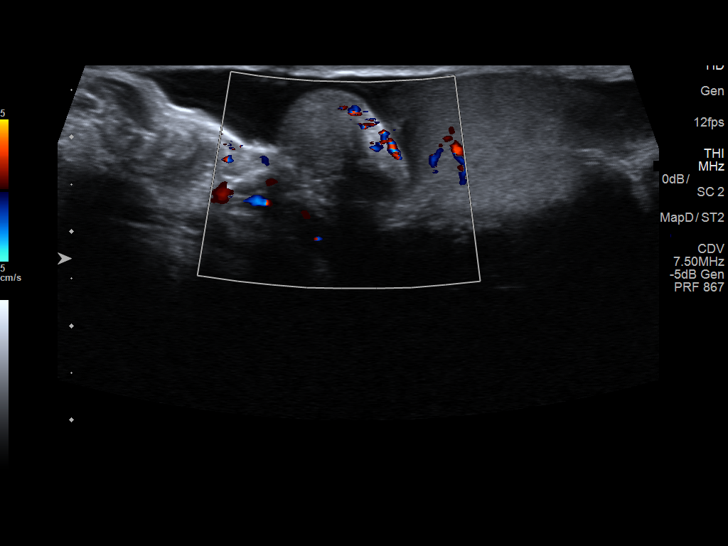
[im 43/57]
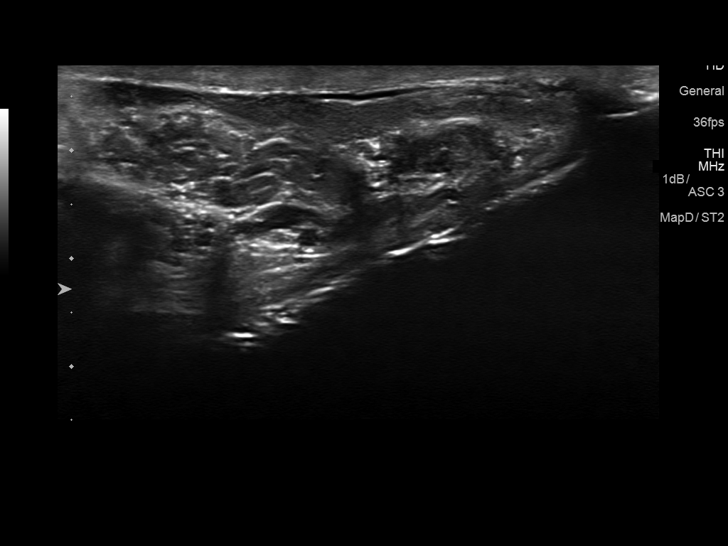
[im 47/57]
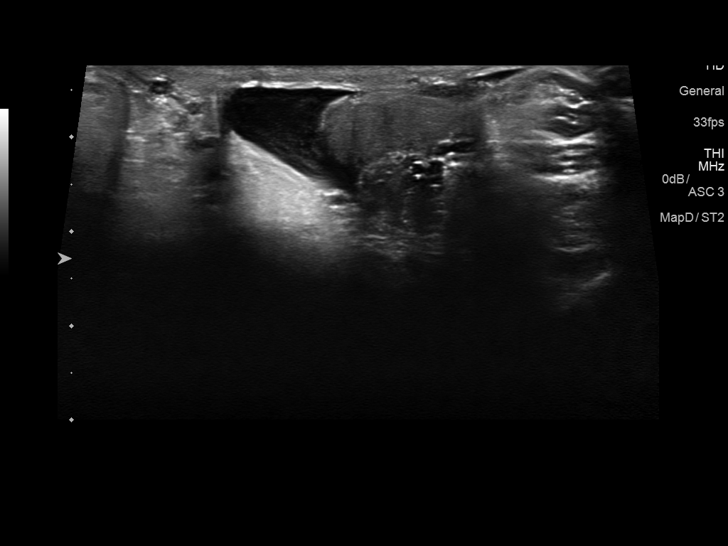
[im 52/57]
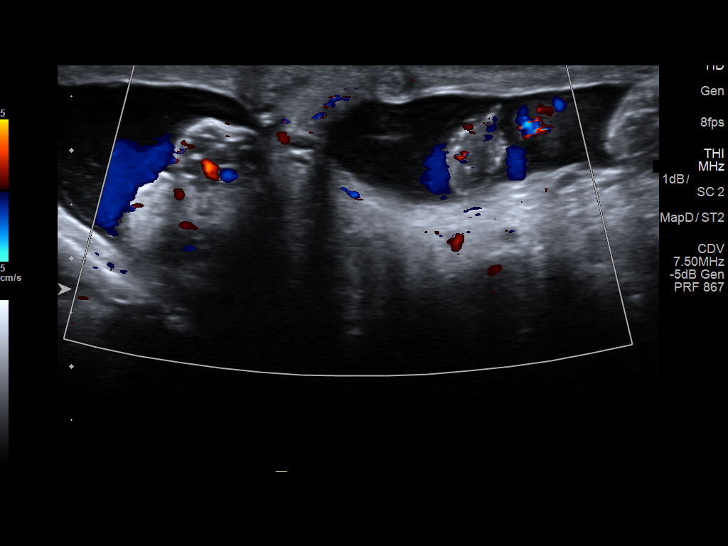
[im 57/57]
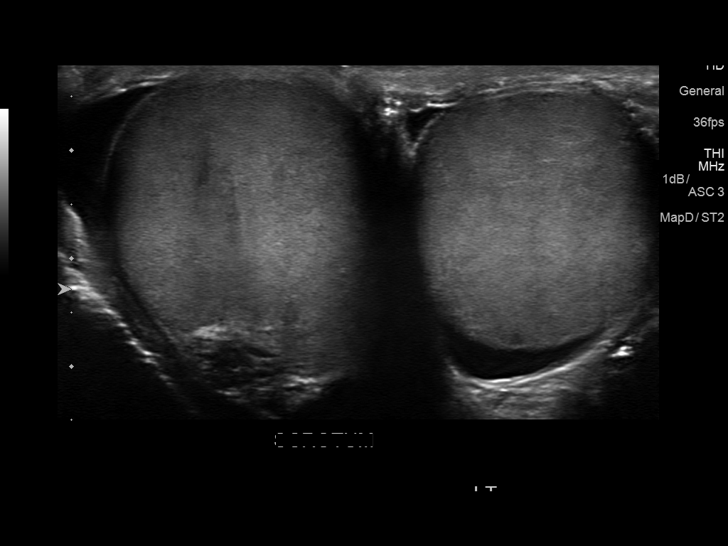

[14 of 25 positions shown; findings below may reference images not displayed]

FINDINGS: Right testicle

Measurements: 5.1 x 2.9 x 3.2 cm. No mass or microlithiasis
visualized.

Left testicle

Measurements: 4.9 x 2.4 x 3.3 cm. No mass or microlithiasis
visualized.

Right epididymis: Normal in size and appearance. 2.2 x 2.0 x 3.0 mm
cyst noted, of doubtful significance.

Left epididymis:  Normal in size and appearance.

Hydrocele:  Small bilateral hydroceles.

Varicocele:  None visualized.

Pulsed Doppler interrogation of both testes demonstrates normal low
resistance arterial and venous waveforms bilaterally.
IMPRESSION: 1. Small bilateral hydroceles.
2. Otherwise normal testicular ultrasound. No mass lesion or other
acute abnormality identified.

## 2020-08-20 ENCOUNTER — Emergency Department (HOSPITAL_COMMUNITY): Payer: Managed Care, Other (non HMO)

## 2020-08-20 ENCOUNTER — Emergency Department (HOSPITAL_COMMUNITY)
Admission: EM | Admit: 2020-08-20 | Discharge: 2020-08-20 | Disposition: A | Discharge status: Home / Self Care | Payer: Managed Care, Other (non HMO) | Attending: Emergency Medicine | Admitting: Emergency Medicine

## 2020-08-20 ENCOUNTER — Other Ambulatory Visit: Payer: Self-pay

## 2020-08-20 ENCOUNTER — Encounter (HOSPITAL_COMMUNITY): Payer: Self-pay | Admitting: Emergency Medicine

## 2020-08-20 DIAGNOSIS — Z23 Encounter for immunization: Secondary | ICD-10-CM | POA: Insufficient documentation

## 2020-08-20 DIAGNOSIS — S61011A Laceration without foreign body of right thumb without damage to nail, initial encounter: Secondary | ICD-10-CM | POA: Diagnosis not present

## 2020-08-20 DIAGNOSIS — Z87891 Personal history of nicotine dependence: Secondary | ICD-10-CM | POA: Insufficient documentation

## 2020-08-20 DIAGNOSIS — W231XXA Caught, crushed, jammed, or pinched between stationary objects, initial encounter: Secondary | ICD-10-CM | POA: Insufficient documentation

## 2020-08-20 DIAGNOSIS — S60931A Unspecified superficial injury of right thumb, initial encounter: Secondary | ICD-10-CM | POA: Diagnosis present

## 2020-08-20 DIAGNOSIS — S61111A Laceration without foreign body of right thumb with damage to nail, initial encounter: Secondary | ICD-10-CM

## 2020-08-20 LAB — CBG MONITORING, ED: Glucose-Capillary: 92 mg/dL (ref 70–99)

## 2020-08-20 MED ORDER — OXYCODONE-ACETAMINOPHEN 5-325 MG PO TABS
1.0000 | ORAL_TABLET | Freq: Once | ORAL | Status: AC
  Administered 2020-08-20: 1 via ORAL
  Filled 2020-08-20: qty 1

## 2020-08-20 MED ORDER — LIDOCAINE HCL (PF) 1 % IJ SOLN
5.0000 mL | Freq: Once | INTRAMUSCULAR | Status: AC
  Administered 2020-08-20: 5 mL
  Filled 2020-08-20: qty 30

## 2020-08-20 MED ORDER — OXYCODONE-ACETAMINOPHEN 5-325 MG PO TABS: 1.0000 | ORAL_TABLET | Freq: Three times a day (TID) | ORAL | 0 refills | Status: AC | PRN

## 2020-08-20 MED ORDER — CEFAZOLIN SODIUM-DEXTROSE 2-4 GM/100ML-% IV SOLN
2.0000 g | Freq: Once | INTRAVENOUS | Status: AC
  Administered 2020-08-20: 2 g via INTRAVENOUS
  Filled 2020-08-20: qty 100

## 2020-08-20 MED ORDER — CEPHALEXIN 500 MG PO CAPS: 500.0000 mg | ORAL_CAPSULE | Freq: Four times a day (QID) | ORAL | 0 refills | Status: AC

## 2020-08-20 MED ORDER — SODIUM CHLORIDE 0.9 % IV BOLUS
500.0000 mL | Freq: Once | INTRAVENOUS | Status: AC
  Administered 2020-08-20: 500 mL via INTRAVENOUS

## 2020-08-20 MED ORDER — TETANUS-DIPHTH-ACELL PERTUSSIS 5-2.5-18.5 LF-MCG/0.5 IM SUSY
0.5000 mL | PREFILLED_SYRINGE | Freq: Once | INTRAMUSCULAR | Status: AC
  Administered 2020-08-20: 0.5 mL via INTRAMUSCULAR
  Filled 2020-08-20: qty 0.5

## 2020-08-20 NOTE — ED Provider Notes (Addendum)
[facility]-EMERGENCY DEPT Provider Note   CSN: [id number] Arrival date & time: 08/20/20  1611     History Chief Complaint  Patient presents with  . Laceration    Gary NightingaleBrett Maxwell is a 34 y.o. male.  HPI   Patient with no significant medical history presents to the emergency department with  chief complaint of right thumb laceration.  Patient states he was using a router today and unfortunately it jumped up and hit the distal end of his right thumb.  He was able to stop the bleeding with pressure but states he has severe pain in his right thumb, denies paresthesias or weakness, is able to move all joints without difficulty.  He denies hitting his head, losing conscious, is not on anticoagulant, he is not immunocompromise.  He states his last tetanus shot was greater than 5 years ago, he denies any alleviating factors.  Patient denies headaches, fevers, chills, shortness of breath, chest pain, abdominal pain, nausea, vomiting, diarrhea, pedal edema.  History reviewed. No pertinent past medical history.  Patient Active Problem List   Diagnosis Date Noted  . Chronic pain of both knees 05/22/2018  . Shin splint of right lower extremity 05/22/2018  . Healthcare maintenance 06/06/2017  . Testicular swelling, left 06/06/2017    Past Surgical History:  Procedure Laterality Date  . CYST EXCISION     lower back  . FOOT FRACTURE SURGERY Right    5th metatarsal  . FRACTURE SURGERY Left    arm       Family History  Problem Relation Age of Onset  . Healthy Sister   . Healthy Brother     Social History   Tobacco Use  . Smoking status: Former Smoker    Packs/day: 1.00    Years: 5.00    Pack years: 5.00    Types: Cigarettes    Quit date: 09/10/2010    Years since quitting: 9.9  . Smokeless tobacco: Never Used  Vaping Use  . Vaping Use: Never used  Substance Use Topics  . Alcohol use: Yes    Comment: occassionally  . Drug use: No    Home  Medications Prior to Admission medications   Medication Sig Start Date End Date Taking? Authorizing Provider  cephALEXin (KEFLEX) 500 MG capsule Take 1 capsule (500 mg total) by mouth 4 (four) times daily for 7 days. 08/20/20 08/27/20  Carroll SageFaulkner, Kloi Brodman J, PA-C  oxyCODONE-acetaminophen (PERCOCET/ROXICET) 5-325 MG tablet Take 1-2 tablets by mouth every 8 (eight) hours as needed for up to 4 days for severe pain. 08/20/20 08/24/20  Carroll SageFaulkner, Levante Simones J, PA-C    Allergies    Patient has no known allergies.  Review of Systems   Review of Systems  Constitutional: Negative for chills and fever.  HENT: Negative for congestion.   Respiratory: Negative for shortness of breath.   Cardiovascular: Negative for chest pain.  Gastrointestinal: Negative for abdominal pain.  Genitourinary: Negative for enuresis.  Musculoskeletal: Negative for back pain.  Skin: Positive for wound. Negative for rash.  Neurological: Negative for dizziness.  Hematological: Does not bruise/bleed easily.    Physical Exam Updated Vital Signs BP 111/68   Pulse 66   Temp 99.5 F (37.5 C)   Resp 17   SpO2 95%   Physical Exam Vitals and nursing note reviewed.  Constitutional:      General: He is not in acute distress.    Appearance: He is not ill-appearing.  HENT:     Head: Normocephalic and atraumatic.  Nose: No congestion.  Eyes:     Conjunctiva/sclera: Conjunctivae normal.  Cardiovascular:     Rate and Rhythm: Normal rate and regular rhythm.     Pulses: Normal pulses.  Pulmonary:     Effort: Pulmonary effort is normal.  Skin:    General: Skin is warm and dry.  Neurological:     Mental Status: He is alert.  Psychiatric:        Mood and Affect: Mood normal.           ED Results / Procedures / Treatments   Labs (all labs ordered are listed, but only abnormal results are displayed) Labs Reviewed  CBG MONITORING, ED    EKG None  Radiology DG Finger Thumb Right  Result Date:  08/20/2020 CLINICAL DATA:  Right thumb injury. EXAM: RIGHT THUMB 2+V COMPARISON:  None. FINDINGS: There is comminuted fracture at the tuft of the distal phalanx of the thumb. No acute osseous abnormality in the remainder of the right thumb. No significant degenerative change. Soft tissue defect in the distal thumb. No radiopaque foreign body. IMPRESSION: Comminuted fracture at the tuft of the distal phalanx of the right thumb with associated soft tissue defect. Electronically Signed   By: Emmaline Kluver M.D.   On: 08/20/2020 16:56    Procedures .Marland KitchenLaceration Repair  Date/Time: 08/20/2020 7:05 PM Performed by: Carroll Sage, PA-C Authorized by: Carroll Sage, PA-C   Consent:    Consent obtained:  Verbal   Consent given by:  Patient   Risks, benefits, and alternatives were discussed: yes     Risks discussed:  Infection, pain, retained foreign body, need for additional repair, poor cosmetic result, tendon damage, vascular damage, poor wound healing and nerve damage   Alternatives discussed:  No treatment Universal protocol:    Procedure explained and questions answered to patient or proxy's satisfaction: yes     Imaging studies available: yes   Anesthesia:    Anesthesia method:  Nerve block   Block needle gauge:  24 G   Block anesthetic:  Lidocaine 1% w/o epi Laceration details:    Location:  Finger   Finger location:  R thumb   Length (cm):  4   Depth (mm):  2 Pre-procedure details:    Preparation:  Patient was prepped and draped in usual sterile fashion and imaging obtained to evaluate for foreign bodies Exploration:    Wound exploration: wound explored through full range of motion     Contaminated: no   Treatment:    Area cleansed with:  Betadine and saline   Amount of cleaning:  Standard   Irrigation solution:  Sterile saline   Irrigation method:  Syringe   Visualized foreign bodies/material removed: no     Debridement:  None Skin repair:    Repair method:   Sutures   Suture size:  4-0   Suture material:  Prolene   Suture technique:  Simple interrupted   Number of sutures:  3 Approximation:    Approximation:  Loose Repair type:    Repair type:  Simple Post-procedure details:    Dressing:  Bulky dressing and splint for protection   Procedure completion:  Tolerated well, no immediate complications Comments:     After the procedure patient motor, sensation, strength were all intact. finger  was soft to the touch with good capillary refill.  No signs of infection were noted, no rash, no ligament or tendon damage present.   (including critical care time)  Medications Ordered in ED  Medications  lidocaine (PF) (XYLOCAINE) 1 % injection 5 mL (5 mLs Infiltration Given by Other 08/20/20 1744)  Tdap (BOOSTRIX) injection 0.5 mL (0.5 mLs Intramuscular Given 08/20/20 1744)  oxyCODONE-acetaminophen (PERCOCET/ROXICET) 5-325 MG per tablet 1 tablet (1 tablet Oral Given 08/20/20 1744)  ceFAZolin (ANCEF) IVPB 2g/100 mL premix (0 g Intravenous Stopped 08/20/20 2000)  sodium chloride 0.9 % bolus 500 mL (0 mLs Intravenous Stopped 08/20/20 2000)    ED Course  I have reviewed the triage vital signs and the nursing notes.  Pertinent labs & imaging results that were available during my care of the patient were reviewed by me and considered in my medical decision making (see chart for details).    MDM Rules/Calculators/A&P                          Patient presents with laceration to his right thumb.  He was alert, did not appear acute distress, vital signs reassuring.  Due to mechanism of injury will obtain x-ray for further evaluation, update patient's tetanus shot, start him on IV antibiotics due to concerns of open fracture and provide pain meds.  Imaging reveals comminuted fracture at the tuft of the distal phalanx of the right thumb with associated soft tissue defects.   consult with hand for further recommendations to close wound.  Spoke with Dr. Izora Ribas  of hand who agrees with current treatment of IV antibiotics, recommends closing the wound as much as possible and allow rest to heal by second intention, apply bulky bandages, place in brace and he will follow-up with him outpatient on Monday.  Will recommend suturing to decrease infection risk and to assist with the healing process.  Patient was agreeable with this and tolerated the procedure well. He received 3 sutures in his right thumb.  Neurovascular was fully intact after the procedure.  Nurse notified me that patient was not feeling well after providing him with 3 sutures. Came into the room patient was cool and clammy heart rate down to 50. Reclined patient in chair, provide him with a bolus of fluids. He is reevaluated after states he is feeling much better. I suspect he vagal down after the procedure. Vital signs stable at this time.  Low suspicion for foreign body in wound as it was thoroughly evaluated no foreign bodies noted my exam, no foreign bodies noted on x-ray. low suspicion for ligament or tendon damage as area was palpated no gross defects noted, he had full range of at his IP joints as well as his MCP joint.  Low suspicion for compartment syndrome as area was palpated it was soft to the touch, neurovascular fully intact before and after the procedure.  Will place patient in a thumb spica to keep it protected, start him on antibiotics and have him follow-up with hand surgery for further evaluation.  Vital signs have remained stable, no indication for hospital admission.  Patient discussed with attending and they agreed with assessment and plan.  Patient given at home care as well strict return precautions.  Patient verbalized that they understood agreed to said plan.     Final Clinical Impression(s) / ED Diagnoses Final diagnoses:  Laceration of right thumb without foreign body with damage to nail, initial encounter    Rx / DC Orders ED Discharge Orders         Ordered     cephALEXin (KEFLEX) 500 MG capsule  4 times daily  08/20/20 1935    oxyCODONE-acetaminophen (PERCOCET/ROXICET) 5-325 MG tablet  Every 8 hours PRN        08/20/20 1935           Barnie Del 08/20/20 2057    Margarita Grizzle, MD 08/20/20 2251    Carroll Sage, PA-C 08/21/20 1002    Margarita Grizzle, MD 08/21/20 2238

## 2020-08-20 NOTE — Discharge Instructions (Signed)
You have a laceration to your right thumb.  Provided with 3 sutures.  Please abstain from the wound wet for the first 24 hours.  After that please run cool water over it and change dressings twice a day for the next 7 days.  I have started you on antibiotics please take as prescribed.  Also gave you narcotics please beware these medication make you drowsy do not consume alcohol or operate heavy machinery.  This medication has Tylenol in it do not take Tylenol while taking it.  You may take over-the-counter pain medication like ibuprofen every 6 hours as needed please follow dosing back of bottle.  It is important a follow-up with hand for further evaluation, provide you with his contact admission above please call on Monday.  Your sutures will have to be removed in the next 5 days, please go to your PCP to have them removed.  Come back to the emergency department if you develop chest pain, shortness of breath, severe abdominal pain, uncontrolled nausea, vomiting, diarrhea.

## 2020-08-20 NOTE — Progress Notes (Signed)
Orthopedic Tech Progress Note Patient Details:  Gary Maxwell 10/08/1985 [id number]  Ortho Devices Type of Ortho Device: Ace wrap,Thumb spica splint Splint Material: Fiberglass Ortho Device/Splint Location: RUE Ortho Device/Splint Interventions: Ordered,Application   Post Interventions Patient Tolerated: Well Instructions Provided: Care of device   Jennye Moccasin 08/20/2020, 8:01 PM

## 2020-08-20 NOTE — Progress Notes (Signed)
A consult was received from an ED physician for Ancef per pharmacy dosing (for an indication other than meningitis). The patient's profile has been reviewed for ht/wt/allergies/indication/available labs. A one time order has been placed for the above antibiotics.  Further antibiotics/pharmacy consults should be ordered by admitting physician if indicated.                       Bernadene Person, PharmD, BCPS [phone] 08/20/2020, 5:26 PM

## 2020-08-20 NOTE — ED Triage Notes (Addendum)
Patient reports injury to right thumb from router. Laceration extending through nail. Bleeding controlled. Last tetanus shot >5 years.

## 2020-08-25 ENCOUNTER — Telehealth: Payer: Self-pay

## 2020-08-25 NOTE — Telephone Encounter (Signed)
Patient aware of the below and verbalized understanding and states he is seeing murphy wainer and will call them for advise. AS, CMA

## 2020-08-25 NOTE — Telephone Encounter (Signed)
Reviewed ED note and patient was suppose to follow up with Dr. Izora Ribas (general surgery- hand specialist). Symptoms possibly related to antibiotic but since I have not seen patient for this and given the extent of injury recommend to reach out to general surgery for options on changing antibiotic or schedule an appointment.  Thank you, Kandis Cocking

## 2020-08-25 NOTE — Telephone Encounter (Signed)
Pt called stating that he was seen at ED on 08/20/2020 for laceration to the right thumb and was prescribed Keflex.  Pt states that he is now having sharp "pelvic" pains" which he rates a 6/10.  He had one episode this yesterday 2 days ago and has had another episode of this since 11am.  Pt states that he is having N&V without diarrhea and no fever.  Please advise.  Tiajuana Amass, CMA

## 2020-09-15 ENCOUNTER — Other Ambulatory Visit: Payer: Managed Care, Other (non HMO)

## 2020-09-15 DIAGNOSIS — Z20822 Contact with and (suspected) exposure to covid-19: Secondary | ICD-10-CM

## 2020-09-17 LAB — SARS-COV-2, NAA 2 DAY TAT

## 2020-09-17 LAB — NOVEL CORONAVIRUS, NAA: SARS-CoV-2, NAA: DETECTED — AB

## 2021-11-25 IMAGING — DX DG FINGER THUMB 2+V*R*
3 series · 3 of 3 positions shown · non-contrast
Comparison: None.

CLINICAL DATA: Right thumb injury.

EXAM:
RIGHT THUMB 2+V

[finger ap]
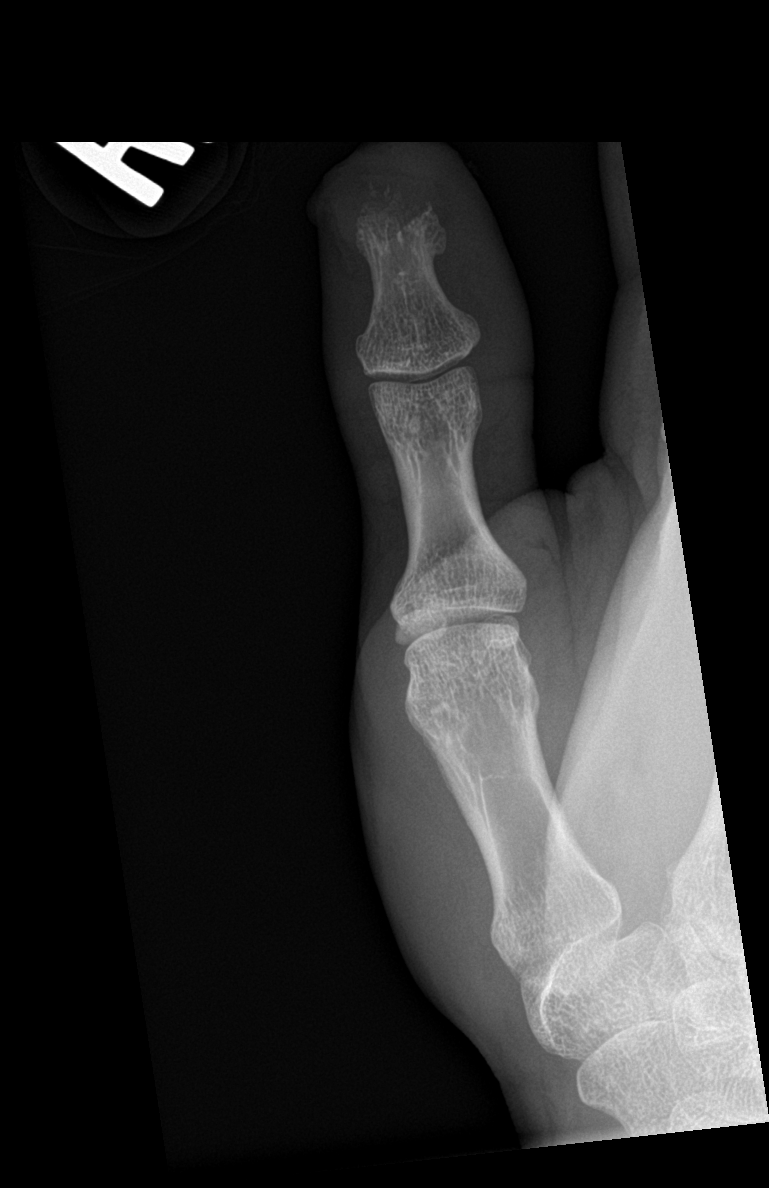

[finger obl]
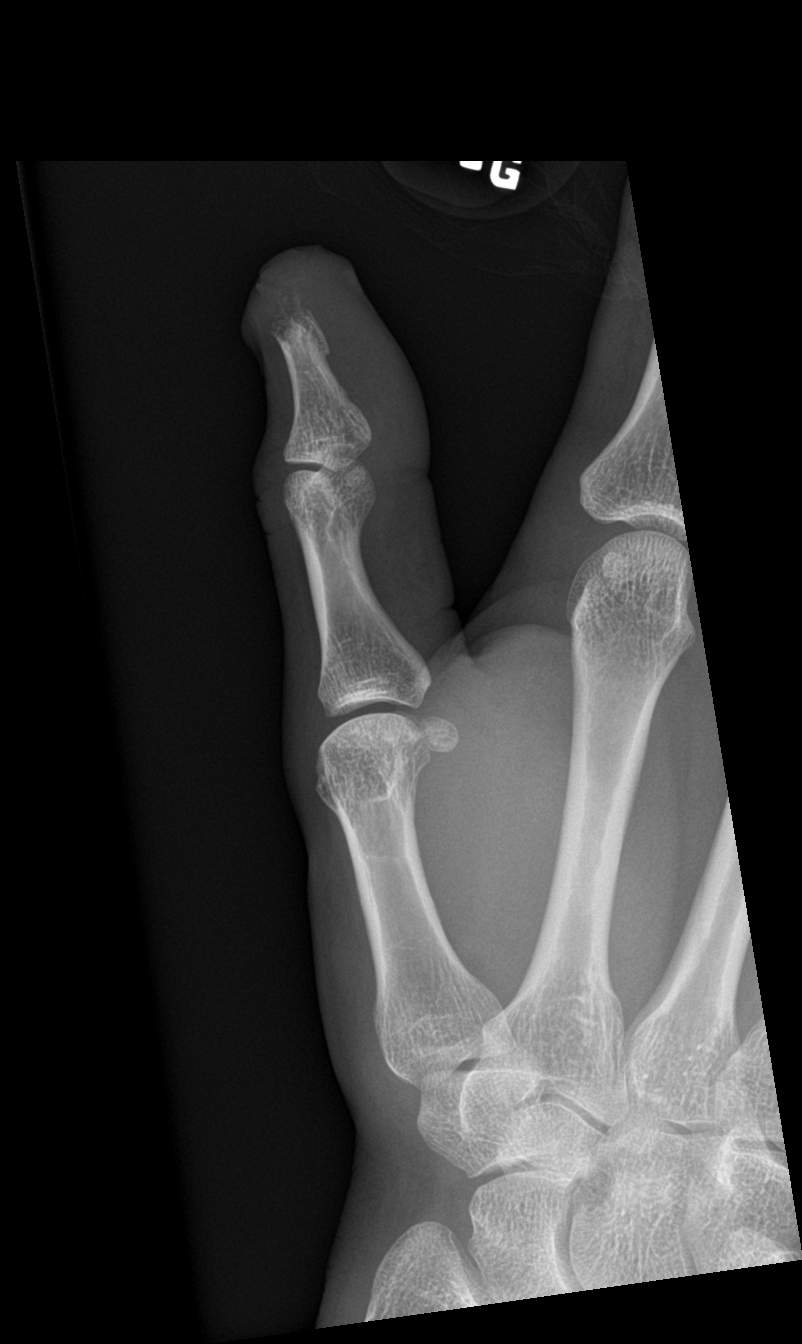

[finger lat]
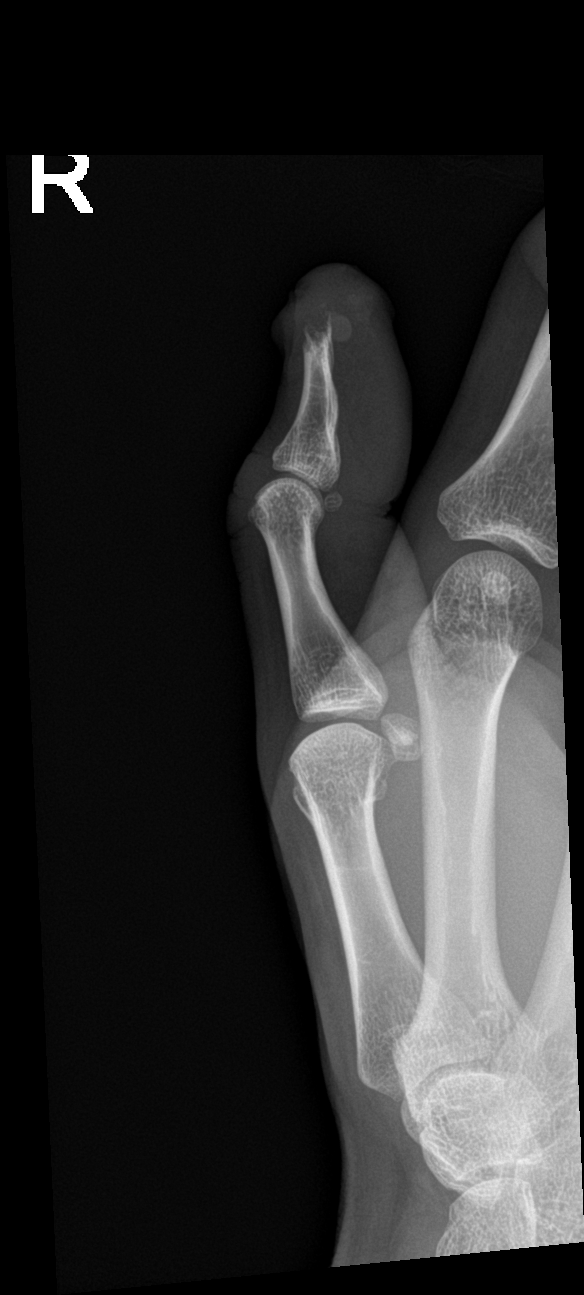

[3 of 3 positions shown; findings below may reference images not displayed]

FINDINGS: There is comminuted fracture at the tuft of the distal phalanx of
the thumb. No acute osseous abnormality in the remainder of the
right thumb. No significant degenerative change. Soft tissue defect
in the distal thumb. No radiopaque foreign body.
IMPRESSION: Comminuted fracture at the tuft of the distal phalanx of the right
thumb with associated soft tissue defect.
